# Patient Record
Sex: Male | Born: 1960 | Race: White | Hispanic: No | Marital: Married | Smoking: Current every day smoker
Health system: Southern US, Community
[De-identification: ages and names within clinical notes are randomized; demographics above are authoritative.]

## PROBLEM LIST (undated history)

## (undated) HISTORY — PX: FOOT SURGERY: SHX648

---

## 2015-02-26 ENCOUNTER — Emergency Department (INDEPENDENT_AMBULATORY_CARE_PROVIDER_SITE_OTHER)
Admission: EM | Admit: 2015-02-26 | Discharge: 2015-02-26 | Disposition: A | Discharge status: Home / Self Care | Payer: BLUE CROSS/BLUE SHIELD | Source: Home / Self Care | Attending: Emergency Medicine | Admitting: Emergency Medicine

## 2015-02-26 DIAGNOSIS — L723 Sebaceous cyst: Secondary | ICD-10-CM

## 2015-02-26 MED ORDER — SULFAMETHOXAZOLE-TRIMETHOPRIM 800-160 MG PO TABS: 1.0000 | ORAL_TABLET | Freq: Two times a day (BID) | ORAL | Status: AC

## 2015-02-26 NOTE — Discharge Instructions (Signed)
Epidermal Cyst An epidermal cyst is sometimes called a sebaceous cyst, epidermal inclusion cyst, or infundibular cyst. These cysts usually contain a substance that looks "pasty" or "cheesy" and may have a bad smell. This substance is a protein called keratin. Epidermal cysts are usually found on the face, neck, or trunk. They may also occur in the vaginal area or other parts of the genitalia of both men and women. Epidermal cysts are usually small, painless, slow-growing bumps or lumps that move freely under the skin. It is important not to try to pop them. This may cause an infection and lead to tenderness and swelling. CAUSES  Epidermal cysts may be caused by a deep penetrating injury to the skin or a plugged hair follicle, often associated with acne. SYMPTOMS  Epidermal cysts can become inflamed and cause:  Redness.  Tenderness.  Increased temperature of the skin over the bumps or lumps.  Grayish-white, bad smelling material that drains from the bump or lump. DIAGNOSIS  Epidermal cysts are easily diagnosed by your caregiver during an exam. Rarely, a tissue sample (biopsy) may be taken to rule out other conditions that may resemble epidermal cysts. TREATMENT   Epidermal cysts often get better and disappear on their own. They are rarely ever cancerous.  If a cyst becomes infected, it may become inflamed and tender. This may require opening and draining the cyst. Treatment with antibiotics may be necessary. When the infection is gone, the cyst may be removed with minor surgery.  Small, inflamed cysts can often be treated with antibiotics or by injecting steroid medicines.  Sometimes, epidermal cysts become large and bothersome. If this happens, surgical removal in your caregiver's office may be necessary. HOME CARE INSTRUCTIONS  Only take over-the-counter or prescription medicines as directed by your caregiver.  Take your antibiotics as directed. Finish them even if you start to feel  better. SEEK MEDICAL CARE IF:   Your cyst becomes tender, red, or swollen.  Your condition is not improving or is getting worse.  You have any other questions or concerns. MAKE SURE YOU:  Understand these instructions.  Will watch your condition.  Will get help right away if you are not doing well or get worse.   This information is not intended to replace advice given to you by your health care provider. Make sure you discuss any questions you have with your health care provider.   Document Released: 04/06/2004 Document Revised: 07/29/2011 Document Reviewed: 11/12/2010 Elsevier Interactive Patient Education 2016 [employer]  Sebaceous Cyst Removal Sebaceous cyst removal is a procedure to remove a sac of oily material that forms under your skin (sebaceous cyst). Sebaceous cysts may also be called epidermoid cysts or keratin cysts. Normally, the skin secretes this oily material through a gland or a hair follicle. This type of cyst usually results when a skin gland or hair follicle becomes blocked. You may need this procedure if you have a sebaceous cyst that becomes large, uncomfortable, or infected. LET YOUR HEALTH CARE PROVIDER KNOW ABOUT:  Any allergies you have.  All medicines you are taking, including vitamins, herbs, eye drops, creams, and over-the-counter medicines.  Previous problems you or members of your family have had with the use of anesthetics.  Any blood disorders you have.  Previous surgeries you have had.  Medical conditions you have. RISKS AND COMPLICATIONS Generally, this is a safe procedure. However, problems may occur, including:  Developing another cyst.  Bleeding.  Infection.  Scarring. BEFORE THE PROCEDURE  Ask your health   care provider about:  Changing or stopping your regular medicines. This is especially important if you are taking diabetes medicines or blood thinners.  Taking medicines such as aspirin and ibuprofen. These medicines  can thin your blood. Do not take these medicines before your procedure if your health care provider instructs you not to.  If you have an infected cyst, you may have to take antibiotic medicines before or after the cyst removal. Take your antibiotics as directed by your health care provider. Finish all of the medicine even if you start to feel better.  Take a shower on the morning of your procedure. Your health care provider may ask you to use a germ-killing (antiseptic) soap. PROCEDURE  You will be given a medicine that numbs the area (local anesthetic).  The skin around the cyst will be cleaned with a germ-killing solution (antiseptic).  Your health care provider will make a small surgical incision over the cyst.  The cyst will be separated from the surrounding tissues that are under your skin.  If possible, the cyst will be removed undamaged (intact).  If the cyst bursts (ruptures), it will need to be removed in pieces.  After the cyst is removed, your health care provider will control any bleeding and close the incision with small stitches (sutures). Small incisions may not need sutures, and the bleeding will be controlled by applying direct pressure with gauze.  Your health care provider may apply antibiotic ointment and a light bandage (dressing) over the incision. This procedure may vary among health care providers and hospitals. AFTER THE PROCEDURE  If your cyst ruptured during surgery, you may need to take antibiotic medicine. If you were prescribed an antibiotic medicine, finish all of it even if you start to feel better.   This information is not intended to replace advice given to you by your health care provider. Make sure you discuss any questions you have with your health care provider.   Document Released: 05/03/2000 Document Revised: 05/27/2014 Document Reviewed: 01/19/2014 Elsevier Interactive Patient Education 2016 Elsevier Inc.   

## 2015-02-26 NOTE — ED Notes (Signed)
Patient states that he has a "bump" on his ear that he thought was a pimple and he tried to "mash" it, it is now red and painful for approx 2 days

## 2015-02-27 NOTE — ED Provider Notes (Signed)
CSN: [id number]     Arrival date & time 02/26/15  1616 History   First MD Initiated Contact with Patient 02/26/15 1637     Chief Complaint  Patient presents with  . Cyst    Left ear      (Consider location/radiation/quality/duration/timing/severity/associated sxs/prior Treatment) Patient is a 54 y.o. male presenting with abscess. The history is provided by the patient. No language interpreter was used.  Abscess Location:  Face Facial abscess location:  Face Size:  2 cm Abscess quality: redness and warmth   Abscess quality: not draining   Red streaking: no   Duration:  2 days Progression:  Worsening Chronicity:  New Context: not diabetes   Relieved by:  Nothing Worsened by:  Nothing tried Ineffective treatments:  None tried Associated symptoms: no fever     History reviewed. No pertinent past medical history. History reviewed. No pertinent past surgical history. History reviewed. No pertinent family history. Social History  Substance Use Topics  . Smoking status: Current Every Day Smoker  . Smokeless tobacco: Never Used  . Alcohol Use: Yes    Review of Systems  Constitutional: Negative for fever.  All other systems reviewed and are negative.     Allergies  Penicillins  Home Medications   Prior to Admission medications   Medication Sig Start Date End Date Taking? Authorizing Provider  sulfamethoxazole-trimethoprim (BACTRIM DS,SEPTRA DS) 800-160 MG tablet Take 1 tablet by mouth 2 (two) times daily. 02/26/15 03/05/15  Lonia Skinner Mikah Poss, PA-C   BP 149/89 mmHg  Pulse 67  Temp(Src) 98.4 F (36.9 C)  Ht 6' (1.829 m)  Wt 230 lb (104.327 kg)  BMI 31.19 kg/m2  SpO2 98% Physical Exam  Constitutional: He is oriented to person, place, and time. He appears well-developed and well-nourished.  HENT:  Head: Normocephalic.  Eyes: EOM are normal.  Neck: Normal range of motion.  Pulmonary/Chest: Effort normal.  Abdominal: He exhibits no distension.  Musculoskeletal:  Normal range of motion.  Neurological: He is alert and oriented to person, place, and time.  Skin:  2cm pointing abscess/cyst ear    Psychiatric: He has a normal mood and affect.  Nursing note and vitals reviewed.   ED Course  .Marland KitchenIncision and Drainage Date/Time: 02/27/2015 9:30 AM Performed by: Elson Areas Authorized by: Elson Areas Consent: Verbal consent obtained. Risks and benefits: risks, benefits and alternatives were discussed Consent given by: patient Patient understanding: patient states understanding of the procedure being performed Patient identity confirmed: verbally with patient Time out: Immediately prior to procedure a "time out" was called to verify the correct patient, procedure, equipment, support staff and site/side marked as required. Type: cyst Body area: head Location details: left external ear Anesthesia: local infiltration Local anesthetic: lidocaine 2% without epinephrine Patient sedated: no Scalpel size: 11 Needle gauge: 22 Incision type: single straight Complexity: simple Drainage: purulent Drainage amount: moderate Wound treatment: wound left open Patient tolerance: Patient tolerated the procedure well with no immediate complications Comments: Large amount of purulent drainage and cystic material including capsule removed   (including critical care time) Labs Review Labs Reviewed - No data to display  Imaging Review No results found. I have personally reviewed and evaluated these images and lab results as part of my medical decision-making.   EKG Interpretation None      MDM  Pt counseled on leaving wound open and drainage.  Pt given rx for bactrim  He is to return if any problems.   Final diagnoses:  Sebaceous cyst  of ear    AVS    Elson Areas, PA-C 02/27/15 [phone]

## 2015-08-22 ENCOUNTER — Emergency Department (INDEPENDENT_AMBULATORY_CARE_PROVIDER_SITE_OTHER)
Admission: EM | Admit: 2015-08-22 | Discharge: 2015-08-22 | Disposition: A | Discharge status: Home / Self Care | Payer: BLUE CROSS/BLUE SHIELD | Source: Home / Self Care | Attending: Family Medicine | Admitting: Family Medicine

## 2015-08-22 ENCOUNTER — Ambulatory Visit (INDEPENDENT_AMBULATORY_CARE_PROVIDER_SITE_OTHER): Payer: BLUE CROSS/BLUE SHIELD | Admitting: Family Medicine

## 2015-08-22 ENCOUNTER — Emergency Department (INDEPENDENT_AMBULATORY_CARE_PROVIDER_SITE_OTHER): Payer: BLUE CROSS/BLUE SHIELD

## 2015-08-22 ENCOUNTER — Encounter: Payer: Self-pay | Admitting: *Deleted

## 2015-08-22 DIAGNOSIS — M25462 Effusion, left knee: Secondary | ICD-10-CM

## 2015-08-22 DIAGNOSIS — M7042 Prepatellar bursitis, left knee: Secondary | ICD-10-CM | POA: Diagnosis not present

## 2015-08-22 NOTE — ED Provider Notes (Signed)
CSN: [id number]     Arrival date & time 08/22/15  1209 History   First MD Initiated Contact with Patient 08/22/15 1226     Chief Complaint  Patient presents with  . Knee Pain   (Consider location/radiation/quality/duration/timing/severity/associated sxs/prior Treatment) HPI  The pt is a 55yo male presenting to [facility] with c/o moderate to significant amount of Left knee swelling that started about 2 weeks ago and has been constant since onset. Denies pain.  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 He occasionally wears kneepads but not all the time. Denies redness, warmth, fever, chills. Denies hx of gout. Denies hx of similar knee swelling. No known injuries.   History reviewed. No pertinent past medical history. Past Surgical History  Procedure Laterality Date  . Foot surgery Left     heel   Family History  Problem Relation Age of Onset  . Diabetes Mother   . Cancer Father    Social History  Substance Use Topics  . Smoking status: Current Every Day Smoker  . Smokeless tobacco: Never Used  . Alcohol Use: Yes    Review of Systems  Constitutional: Negative for fever and chills.  Musculoskeletal: Positive for joint swelling. Negative for myalgias and arthralgias.       Left knee swelling  Skin: Negative for color change and wound.    Allergies  Penicillins  Home Medications   Prior to Admission medications   Not on File   Meds Ordered and Administered this Visit  Medications - No data to display  BP 129/88 mmHg  Pulse 77  Wt 223 lb (101.152 kg)  SpO2 97% No data found.   Physical Exam  Constitutional: He is oriented to person, place, and time. He appears well-developed and well-nourished.  HENT:  Head: Normocephalic and atraumatic.  Eyes: EOM are normal.  Neck: Normal range of motion.  Cardiovascular: Normal rate.   Pulmonary/Chest: Effort normal.  Musculoskeletal: Normal range of motion. He exhibits edema. He exhibits no tenderness.  Left  knee: moderate to significant amount of swelling over patella and tibial tuberosity. Non-tender. Fluctuance present. Full ROM knee. No tenderness to posterior, medial or lateral aspects of knee. Calf is soft, non-tender.   Neurological: He is alert and oriented to person, place, and time.  Skin: Skin is warm and dry. No erythema.  Left knee: skin in tact. No erythema, ecchymosis or warmth.   Psychiatric: He has a normal mood and affect. His behavior is normal.  Nursing note and vitals reviewed.   ED Course  Procedures (including critical care time)  Labs Review Labs Reviewed  BODY FLUID CULTURE  SYNOVIAL CELL COUNT + DIFF, W/ CRYSTALS    Imaging Review Dg Knee Complete 4 Views Left  08/22/2015  CLINICAL DATA:  Left anterior knee pain and swelling for 3 weeks, worked on hardfloor EXAM: LEFT KNEE - COMPLETE 4+ VIEW COMPARISON:  None. FINDINGS: Four views of the left knee submitted. No acute fracture or subluxation. Significant soft tissue swelling is noted prepatellar infrapatellar. Prepatellar bursitis cannot be excluded. No joint effusion. IMPRESSION: There is prepatellar and infrapatellar soft tissue swelling. Prepatellar bursitis or cellulitis cannot be excluded. Clinical correlation is necessary. No joint effusion. No acute fracture or subluxation. Electronically Signed   By: Natasha MeadLiviu  Pop M.D.   On: 08/22/2015 12:49       MDM   1. Effusion of bursa of knee, left   2. Knee swelling, left    Pt c/o Left knee  swelling.  Exam c/o bursitis of Left knee. Low concern for septic joint or gout at this time.  Consulted with Dr. Denyse Amass, Sports Medicine, who also examined pt and imaging.  Knee drained in UC.  See consult note by Dr. Denyse Amass.  Pt to f/u with Dr. Denyse Amass in 2-4 weeks. Home care instructions provided.    Junius Finner, PA-C 08/22/15 1404

## 2015-08-22 NOTE — ED Notes (Signed)
Pt c/o 2 weeks of left knee pain and swelling. He installs floors for a living.

## 2015-08-22 NOTE — Assessment & Plan Note (Signed)
Status post aspiration and injection today. Culture differential crystal analysis pending.  Plan for knee sleeve and knee padding. Return in 2-4 weeks.

## 2015-08-22 NOTE — Patient Instructions (Signed)
Thank you for coming in today. Use a neoprene knee sleeve.  Use knee pads.  Return in 2-4 weeks if needed.   Prepatellar Bursitis With Rehab  Bursitis is a condition that is characterized by inflammation of a bursa. Sergio Nguyen exists in many areas of the body. They are fluid-filled sacs that lie between a soft tissue (skin, tendon, or ligament) and a bone, and they reduce friction between the structures as well as the stress placed on the soft tissue. Prepatellar bursitis is inflammation of the bursa that lies between the skin and the kneecap (patella). This condition often causes pain over the patella. SYMPTOMS   Pain, tenderness, and/or inflammation over the patella.  Pain that worsens with movement of the knee joint.  Decreased range of motion for the knee joint.  A crackling sound (crepitation) when the bursa is moved or touched.  Occasionally, painless swelling of the bursa.  Fever (when infected). CAUSES  Bursitis is caused by damage to the bursa, which results in an inflammatory response. Common mechanisms of injury include:  Direct trauma to the front of the knee.  Repetitive and/or stressful use of the knee. RISK INCREASES WITH:  Activities in which kneeling and/or falling on one's knees is likely (volleyball or football).  Repetitive and stressful training, especially if it involves running on hills.  Improper training techniques, such as a sudden increase in the intensity, frequency, or duration of training.  Failure to warm up properly before activity.  Poor technique.  Artificial turf. PREVENTION   Avoid kneeling or falling on your knees.  Warm up and stretch properly before activity.  Allow for adequate recovery between workouts.  Maintain physical fitness:  Strength, flexibility, and endurance.  Cardiovascular fitness.  Learn and use proper technique. When possible, have a coach correct improper technique.  Wear properly fitted and padded protective  equipment (knee pads). PROGNOSIS  If treated properly, then the symptoms of prepatellar bursitis usually resolve within 2 weeks. RELATED COMPLICATIONS   Recurrent symptoms that result in a chronic problem.  Prolonged healing time, if improperly treated or reinjured.  Limited range of motion.  Infection of bursa.  Chronic inflammation or scarring of bursa. TREATMENT  Treatment initially involves the use of ice and medication to help reduce pain and inflammation. The use of strengthening and stretching exercises may help reduce pain with activity, especially those of the quadriceps and hamstring muscles. These exercises may be performed at home or with referral to a therapist. Your caregiver may recommend knee pads when you return to playing sports, in order to reduce the stress on the prepatellar bursa. If symptoms persist despite treatment, then your caregiver may drain fluid out with a needle (aspirate) the bursa. If symptoms persist for greater than 6 months despite nonsurgical (conservative) treatment, then surgery may be recommended to remove the bursa.  MEDICATION  If pain medication is necessary, then nonsteroidal anti-inflammatory medications, such as aspirin and ibuprofen, or other minor pain relievers, such as acetaminophen, are often recommended.  Do not take pain medication for 7 days before surgery.  Prescription pain relievers may be given if deemed necessary by your caregiver. Use only as directed and only as much as you need.  Corticosteroid injections may be given by your caregiver. These injections should be reserved for the most serious cases, because they may only be given a certain number of times. HEAT AND COLD  Cold treatment (icing) relieves pain and reduces inflammation. Cold treatment should be applied for 10 to 15 minutes  every 2 to 3 hours for inflammation and pain and immediately after any activity that aggravates your symptoms. Use ice packs or massage the area  with a piece of ice (ice massage).  Heat treatment may be used prior to performing the stretching and strengthening activities prescribed by your caregiver, physical therapist, or athletic trainer. Use a heat pack or soak the injury in warm water. SEEK MEDICAL CARE IF:  Treatment seems to offer no benefit, or the condition worsens.  Any medications produce adverse side effects. EXERCISES RANGE OF MOTION (ROM) AND STRETCHING EXERCISES - Prepatellar Bursitis These exercises may help you when beginning to rehabilitate your injury. Your symptoms may resolve with or without further involvement from your physician, physical therapist or athletic trainer. While completing these exercises, remember:   Restoring tissue flexibility helps normal motion to return to the joints. This allows healthier, less painful movement and activity.  An effective stretch should be held for at least 30 seconds.  A stretch should never be painful. You should only feel a gentle lengthening or release in the stretched tissue. STRETCH - Hamstrings, Standing  Stand or sit and extend your right / left leg, placing your foot on a chair or foot stool  Keeping a slight arch in your low back and your hips straight forward.  Lead with your chest and lean forward at the waist until you feel a gentle stretch in the back of your right / left knee or thigh. (When done correctly, this exercise requires leaning only a small distance.)  Hold this position for __________ seconds. Repeat __________ times. Complete this stretch __________ times per day. STRETCH - Quadriceps, Prone   Lie on your stomach on a firm surface, such as a bed or padded floor.  Bend your right / left knee and grasp your ankle. If you are unable to reach, your ankle or pant leg, use a belt around your foot to lengthen your reach.  Gently pull your heel toward your buttocks. Your knee should not slide out to the side. You should feel a stretch in the front  of your thigh and/or knee.  Hold this position for __________ seconds. Repeat __________ times. Complete this stretch __________ times per day.  STRETCH - Hamstrings/Adductors, V-Sit   Sit on the floor with your legs extended in a large "V," keeping your knees straight.  With your head and chest upright, bend at your waist reaching for your right foot to stretch your left adductors.  You should feel a stretch in your left inner thigh. Hold for __________ seconds.  Return to the upright position to relax your leg muscles.  Continuing to keep your chest upright, bend straight forward at your waist to stretch your hamstrings.  You should feel a stretch behind both of your thighs and/or knees. Hold for __________ seconds.  Return to the upright position to relax your leg muscles.  Repeat steps 2 through 4. Repeat __________ times. Complete this exercise __________ times per day.  STRENGTHENING EXERCISES - Prepatellar Bursitis  These exercises may help you when beginning to rehabilitate your injury. They may resolve your symptoms with or without further involvement from your physician, physical therapist or athletic trainer. While completing these exercises, remember:  Muscles can gain both the endurance and the strength needed for everyday activities through controlled exercises.  Complete these exercises as instructed by your physician, physical therapist or athletic trainer. Progress the resistance and repetitions only as guided. STRENGTH - Quadriceps, Isometrics  Lie on your  back with your right / left leg extended and your opposite knee bent.  Gradually tense the muscles in the front of your right / left thigh. You should see either your kneecap slide up toward your hip or increased dimpling just above the knee. This motion will push the back of the knee down toward the floor/mat/bed on which you are lying.  Hold the muscle as tight as you can without increasing your pain for  __________ seconds.  Relax the muscles slowly and completely in between each repetition. Repeat __________ times. Complete this exercise __________ times per day.  STRENGTH - Quadriceps, Short Arcs   Lie on your back. Place a __________ inch towel roll under your knee so that the knee slightly bends.  Raise only your lower leg by tightening the muscles in the front of your thigh. Do not allow your thigh to rise.  Hold this position for __________ seconds. Repeat __________ times. Complete this exercise __________ times per day.  OPTIONAL ANKLE WEIGHTS: Begin with ____________________, but DO NOT exceed ____________________. Increase in1 lb/0.5 kg increments.  STRENGTH - Quadriceps, Straight Leg Raises  Quality counts! Watch for signs that the quadriceps muscle is working to insure you are strengthening the correct muscles and not "cheating" by substituting with healthier muscles.  Lay on your back with your right / left leg extended and your opposite knee bent.  Tense the muscles in the front of your right / left thigh. You should see either your kneecap slide up or increased dimpling just above the knee. Your thigh may even quiver.  Tighten these muscles even more and raise your leg 4 to 6 inches off the floor. Hold for __________ seconds.  Keeping these muscles tense, lower your leg.  Relax the muscles slowly and completely in between each repetition. Repeat __________ times. Complete this exercise __________ times per day.  STRENGTH - Quadriceps, Step-Ups   Use a thick book, step or step stool that is __________ inches tall.  Holding a wall or counter for balance only, not support.  Slowly step-up with your right / left foot, keeping your knee in line with your hip and foot. Do not allow your knee to bend so far that you cannot see your toes.  Slowly unlock your knee and lower yourself to the starting position. Your muscles, not gravity, should lower you. Repeat __________  times. Complete this exercise __________ times per day.   This information is not intended to replace advice given to you by your health care provider. Make sure you discuss any questions you have with your health care provider.   Document Released: 05/06/2005 Document Revised: 01/25/2015 Document Reviewed: 08/18/2008 Elsevier Interactive Patient Education [employer]

## 2015-08-22 NOTE — Progress Notes (Signed)
   Subjective:    I'm seeing this patient as a consultation for:  Junius FinnerErin O'Malley PA-C  CC: Left knee swelling  HPI: Patient notes a 3 week history of swelling at the anterior portion of his left knee. 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 He denies any specific injury. He denies pain fevers or chills. He feels well. He has not tried any treatment yet. No locking or catching or difficulty with knee motion.  Past medical history, Surgical history, Family history not pertinant except as noted below, Social history, Allergies, and medications have been entered into the medical record, reviewed, and no changes needed.   Review of Systems: No headache, visual changes, nausea, vomiting, diarrhea, constipation, dizziness, abdominal pain, skin rash, fevers, chills, night sweats, weight loss, swollen lymph nodes, body aches, joint swelling, muscle aches, chest pain, shortness of breath, mood changes, visual or auditory hallucinations.   Objective:   BP 129/88 mmHg  Pulse 77  Wt 223 lb (101.152 kg)  SpO2 97% General: Well Developed, well nourished, and in no acute distress.  Neuro/Psych: Alert and oriented x3, extra-ocular muscles intact, able to move all 4 extremities, sensation grossly intact. Skin: Warm and dry, no rashes noted.  Respiratory: Not using accessory muscles, speaking in full sentences, trachea midline.  Cardiovascular: Pulses palpable, no extremity edema. Abdomen: Does not appear distended. MSK: Left knee is normal-appearing with the exception of fluctuance on the anterior portion of the knee. No tenderness induration or erythema is present. Knee motion is normal. Stable ligamentous exam.  Aspiration and injection of left knee prepatellar bursitis: Consent obtained and timeout performed. Skin cleaned with alcohol and cold spray applied. 18-gauge needle used to access the prepatellar bursa. 20 ml of serosanguineous fluid was  removed. Syringe was exchanged and 80 mg of Depo-Medrol and 1 mL of lidocaine without epinephrine was injected. Compress Dressing was applied. Patient tolerated the procedure well.  No results found for this or any previous visit (from the past 24 hour(s)). Dg Knee Complete 4 Views Left  08/22/2015  CLINICAL DATA:  Left anterior knee pain and swelling for 3 weeks, worked on hardfloor EXAM: LEFT KNEE - COMPLETE 4+ VIEW COMPARISON:  None. FINDINGS: Four views of the left knee submitted. No acute fracture or subluxation. Significant soft tissue swelling is noted prepatellar infrapatellar. Prepatellar bursitis cannot be excluded. No joint effusion. IMPRESSION: There is prepatellar and infrapatellar soft tissue swelling. Prepatellar bursitis or cellulitis cannot be excluded. Clinical correlation is necessary. No joint effusion. No acute fracture or subluxation. Electronically Signed   By: Natasha MeadLiviu  Pop M.D.   On: 08/22/2015 12:49    Impression and Recommendations:   This case required medical decision making of moderate complexity.

## 2015-08-23 LAB — SYNOVIAL CELL COUNT + DIFF, W/ CRYSTALS
BASOPHILS, %: 0 %
EOSINOPHILS-SYNOVIAL: 0 % (ref 0–2)
LYMPHOCYTES-SYNOVIAL FLD: 18 % (ref 0–74)
Monocyte/Macrophage: 73 % — ABNORMAL HIGH (ref 0–69)
NEUTROPHIL, SYNOVIAL: 9 % (ref 0–24)
Synoviocytes, %: 6 % (ref 0–15)
WBC, SYNOVIAL: 390 {cells}/uL — AB (ref <150)

## 2015-08-25 NOTE — Progress Notes (Signed)
Quick Note:  Culture of fluid from knee is negative so far ______

## 2015-08-26 ENCOUNTER — Telehealth: Payer: Self-pay | Admitting: Emergency Medicine

## 2015-08-26 LAB — BODY FLUID CULTURE
Gram Stain: NONE SEEN
ORGANISM ID, BACTERIA: NO GROWTH

## 2015-08-28 NOTE — Progress Notes (Signed)
Quick Note:  Normal, no changes. ______ 

## 2015-09-15 ENCOUNTER — Encounter: Payer: Self-pay | Admitting: Family Medicine

## 2015-09-15 DIAGNOSIS — M11269 Other chondrocalcinosis, unspecified knee: Secondary | ICD-10-CM | POA: Insufficient documentation

## 2015-09-15 NOTE — Progress Notes (Signed)
Quick Note:  Fluid removed from knee shows pseudogout. Please return to clinic to discuss this further. ______

## 2015-10-09 ENCOUNTER — Ambulatory Visit (INDEPENDENT_AMBULATORY_CARE_PROVIDER_SITE_OTHER): Payer: BLUE CROSS/BLUE SHIELD | Admitting: Family Medicine

## 2015-10-09 ENCOUNTER — Encounter: Payer: Self-pay | Admitting: Family Medicine

## 2015-10-09 VITALS — BP 131/87 | HR 83 | Wt 223.0 lb

## 2015-10-09 DIAGNOSIS — M11262 Other chondrocalcinosis, left knee: Secondary | ICD-10-CM

## 2015-10-09 DIAGNOSIS — M11862 Other specified crystal arthropathies, left knee: Secondary | ICD-10-CM

## 2015-10-09 DIAGNOSIS — M7042 Prepatellar bursitis, left knee: Secondary | ICD-10-CM | POA: Diagnosis not present

## 2015-10-09 NOTE — Patient Instructions (Signed)
Thank you for coming in today. Call or go to the ER if you develop a large red swollen joint with extreme pain or oozing puss.  Keep off your knee if able.  If it returns there is a surgery.  If it is not bothersome I am not sure it makes sense to keep draining it.   Prepatellar Bursitis With Rehab  Bursitis is a condition that is characterized by inflammation of a bursa. Kateri Mc exists in many areas of the body. They are fluid-filled sacs that lie between a soft tissue (skin, tendon, or ligament) and a bone, and they reduce friction between the structures as well as the stress placed on the soft tissue. Prepatellar bursitis is inflammation of the bursa that lies between the skin and the kneecap (patella). This condition often causes pain over the patella. SYMPTOMS   Pain, tenderness, and/or inflammation over the patella.  Pain that worsens with movement of the knee joint.  Decreased range of motion for the knee joint.  A crackling sound (crepitation) when the bursa is moved or touched.  Occasionally, painless swelling of the bursa.  Fever (when infected). CAUSES  Bursitis is caused by damage to the bursa, which results in an inflammatory response. Common mechanisms of injury include:  Direct trauma to the front of the knee.  Repetitive and/or stressful use of the knee. RISK INCREASES WITH:  Activities in which kneeling and/or falling on one's knees is likely (volleyball or football).  Repetitive and stressful training, especially if it involves running on hills.  Improper training techniques, such as a sudden increase in the intensity, frequency, or duration of training.  Failure to warm up properly before activity.  Poor technique.  Artificial turf. PREVENTION   Avoid kneeling or falling on your knees.  Warm up and stretch properly before activity.  Allow for adequate recovery between workouts.  Maintain physical fitness:  Strength, flexibility, and  endurance.  Cardiovascular fitness.  Learn and use proper technique. When possible, have a coach correct improper technique.  Wear properly fitted and padded protective equipment (knee pads). PROGNOSIS  If treated properly, then the symptoms of prepatellar bursitis usually resolve within 2 weeks. RELATED COMPLICATIONS   Recurrent symptoms that result in a chronic problem.  Prolonged healing time, if improperly treated or reinjured.  Limited range of motion.  Infection of bursa.  Chronic inflammation or scarring of bursa. TREATMENT  Treatment initially involves the use of ice and medication to help reduce pain and inflammation. The use of strengthening and stretching exercises may help reduce pain with activity, especially those of the quadriceps and hamstring muscles. These exercises may be performed at home or with referral to a therapist. Your caregiver may recommend knee pads when you return to playing sports, in order to reduce the stress on the prepatellar bursa. If symptoms persist despite treatment, then your caregiver may drain fluid out with a needle (aspirate) the bursa. If symptoms persist for greater than 6 months despite nonsurgical (conservative) treatment, then surgery may be recommended to remove the bursa.  MEDICATION  If pain medication is necessary, then nonsteroidal anti-inflammatory medications, such as aspirin and ibuprofen, or other minor pain relievers, such as acetaminophen, are often recommended.  Do not take pain medication for 7 days before surgery.  Prescription pain relievers may be given if deemed necessary by your caregiver. Use only as directed and only as much as you need.  Corticosteroid injections may be given by your caregiver. These injections should be reserved for the  most serious cases, because they may only be given a certain number of times. HEAT AND COLD  Cold treatment (icing) relieves pain and reduces inflammation. Cold treatment should  be applied for 10 to 15 minutes every 2 to 3 hours for inflammation and pain and immediately after any activity that aggravates your symptoms. Use ice packs or massage the area with a piece of ice (ice massage).  Heat treatment may be used prior to performing the stretching and strengthening activities prescribed by your caregiver, physical therapist, or athletic trainer. Use a heat pack or soak the injury in warm water. SEEK MEDICAL CARE IF:  Treatment seems to offer no benefit, or the condition worsens.  Any medications produce adverse side effects. EXERCISES RANGE OF MOTION (ROM) AND STRETCHING EXERCISES - Prepatellar Bursitis These exercises may help you when beginning to rehabilitate your injury. Your symptoms may resolve with or without further involvement from your physician, physical therapist or athletic trainer. While completing these exercises, remember:   Restoring tissue flexibility helps normal motion to return to the joints. This allows healthier, less painful movement and activity.  An effective stretch should be held for at least 30 seconds.  A stretch should never be painful. You should only feel a gentle lengthening or release in the stretched tissue. STRETCH - Hamstrings, Standing  Stand or sit and extend your right / left leg, placing your foot on a chair or foot stool  Keeping a slight arch in your low back and your hips straight forward.  Lead with your chest and lean forward at the waist until you feel a gentle stretch in the back of your right / left knee or thigh. (When done correctly, this exercise requires leaning only a small distance.)  Hold this position for __________ seconds. Repeat __________ times. Complete this stretch __________ times per day. STRETCH - Quadriceps, Prone   Lie on your stomach on a firm surface, such as a bed or padded floor.  Bend your right / left knee and grasp your ankle. If you are unable to reach, your ankle or pant leg, use a  belt around your foot to lengthen your reach.  Gently pull your heel toward your buttocks. Your knee should not slide out to the side. You should feel a stretch in the front of your thigh and/or knee.  Hold this position for __________ seconds. Repeat __________ times. Complete this stretch __________ times per day.  STRETCH - Hamstrings/Adductors, V-Sit   Sit on the floor with your legs extended in a large "V," keeping your knees straight.  With your head and chest upright, bend at your waist reaching for your right foot to stretch your left adductors.  You should feel a stretch in your left inner thigh. Hold for __________ seconds.  Return to the upright position to relax your leg muscles.  Continuing to keep your chest upright, bend straight forward at your waist to stretch your hamstrings.  You should feel a stretch behind both of your thighs and/or knees. Hold for __________ seconds.  Return to the upright position to relax your leg muscles.  Repeat steps 2 through 4. Repeat __________ times. Complete this exercise __________ times per day.  STRENGTHENING EXERCISES - Prepatellar Bursitis  These exercises may help you when beginning to rehabilitate your injury. They may resolve your symptoms with or without further involvement from your physician, physical therapist or athletic trainer. While completing these exercises, remember:  Muscles can gain both the endurance and the strength needed  for everyday activities through controlled exercises.  Complete these exercises as instructed by your physician, physical therapist or athletic trainer. Progress the resistance and repetitions only as guided. STRENGTH - Quadriceps, Isometrics  Lie on your back with your right / left leg extended and your opposite knee bent.  Gradually tense the muscles in the front of your right / left thigh. You should see either your kneecap slide up toward your hip or increased dimpling just above the  knee. This motion will push the back of the knee down toward the floor/mat/bed on which you are lying.  Hold the muscle as tight as you can without increasing your pain for __________ seconds.  Relax the muscles slowly and completely in between each repetition. Repeat __________ times. Complete this exercise __________ times per day.  STRENGTH - Quadriceps, Short Arcs   Lie on your back. Place a __________ inch towel roll under your knee so that the knee slightly bends.  Raise only your lower leg by tightening the muscles in the front of your thigh. Do not allow your thigh to rise.  Hold this position for __________ seconds. Repeat __________ times. Complete this exercise __________ times per day.  OPTIONAL ANKLE WEIGHTS: Begin with ____________________, but DO NOT exceed ____________________. Increase in1 lb/0.5 kg increments.  STRENGTH - Quadriceps, Straight Leg Raises  Quality counts! Watch for signs that the quadriceps muscle is working to insure you are strengthening the correct muscles and not "cheating" by substituting with healthier muscles.  Lay on your back with your right / left leg extended and your opposite knee bent.  Tense the muscles in the front of your right / left thigh. You should see either your kneecap slide up or increased dimpling just above the knee. Your thigh may even quiver.  Tighten these muscles even more and raise your leg 4 to 6 inches off the floor. Hold for __________ seconds.  Keeping these muscles tense, lower your leg.  Relax the muscles slowly and completely in between each repetition. Repeat __________ times. Complete this exercise __________ times per day.  STRENGTH - Quadriceps, Step-Ups   Use a thick book, step or step stool that is __________ inches tall.  Holding a wall or counter for balance only, not support.  Slowly step-up with your right / left foot, keeping your knee in line with your hip and foot. Do not allow your knee to bend so  far that you cannot see your toes.  Slowly unlock your knee and lower yourself to the starting position. Your muscles, not gravity, should lower you. Repeat __________ times. Complete this exercise __________ times per day.   This information is not intended to replace advice given to you by your health care provider. Make sure you discuss any questions you have with your health care provider.   Document Released: 05/06/2005 Document Revised: 01/25/2015 Document Reviewed: 08/18/2008 Elsevier Interactive Patient Education [employer]

## 2015-10-09 NOTE — Progress Notes (Signed)
   Sergio Nguyen is a 55 y.o. male who [facility] today for follow-up prepatellar bursitis. Patient was seen last month for prepatellar bursitis of the left knee. Aspiration and injection. Fluid aspiration revealed pseudogout crystals without infection. He felt a bit better however was unable to stay off of his knees. He works as a Art gallery managerhardwood floor installer. Symptoms returned recently. He notes painless swelling without injury.   No past medical history on file. Past Surgical History  Procedure Laterality Date  . Foot surgery Left     heel   Social History  Substance Use Topics  . Smoking status: Current Every Day Smoker  . Smokeless tobacco: Never Used  . Alcohol Use: Yes   family history includes Cancer in his father; Diabetes in his mother.  ROS:  No headache, visual changes, nausea, vomiting, diarrhea, constipation, dizziness, abdominal pain, skin rash, fevers, chills, night sweats, weight loss, swollen lymph nodes, body aches, joint swelling, muscle aches, chest pain, shortness of breath, mood changes, visual or auditory hallucinations.    Medications: No current outpatient prescriptions on file.   No current facility-administered medications for this visit.   Allergies  Allergen Reactions  . Penicillins      Exam:  BP 131/87 mmHg  Pulse 83  Wt 223 lb (101.152 kg) General: Well Developed, well nourished, and in no acute distress.  Neuro/Psych: Alert and oriented x3, extra-ocular muscles intact, able to move all 4 extremities, sensation grossly intact. Skin: Warm and dry, no rashes noted.  Respiratory: Not using accessory muscles, speaking in full sentences, trachea midline.  Cardiovascular: Pulses palpable, no extremity edema. Abdomen: Does not appear distended. MSK: Left knee large prepatellar bursa nontender without any skin erythema or induration.  Procedure: Real-time Ultrasound Guided aspiration and  injection of left prepatellar bursa  Device: GE Logiq E  Images permanently stored and available for review in the ultrasound unit. Verbal informed consent obtained. Discussed risks and benefits of procedure. Warned about infection bleeding damage to structures skin hypopigmentation and fat atrophy among others. Patient expresses understanding and agreement Time-out conducted.  Noted no overlying erythema, induration, or other signs of local infection.  Skin prepped in a sterile fashion.  Local anesthesia: Topical Ethyl chloride.  With sterile technique and under real time ultrasound guidance:18-gauge needle used to access the prepatellar space. 18 mL of blood colored fluid aspirated. Syringe exchanged and 40 mg of Kenalog and 4 mL of Marcaine injected  Completed without difficulty    Advised to call if fevers/chills, erythema, induration, drainage, or persistent bleeding.  Images permanently stored and available for review in the ultrasound unit.  Impression: Technically successful ultrasound guided injection.     No results found for this or any previous visit (from the past 24 hour(s)). No results found.  55 year old male with recurrent traumatic left knee prepatellar bursitis. Aspiration culture pending. Continue compressive knee sleeves and kneepads. Recommend patient not kneel for the next several weeks. Return as needed.

## 2015-10-12 NOTE — Progress Notes (Signed)
Quick Note:  Knee fluid culture is normal. ______

## 2015-10-13 LAB — BODY FLUID CULTURE
Gram Stain: NONE SEEN
Organism ID, Bacteria: NO GROWTH

## 2016-01-20 ENCOUNTER — Emergency Department (INDEPENDENT_AMBULATORY_CARE_PROVIDER_SITE_OTHER)
Admission: EM | Admit: 2016-01-20 | Discharge: 2016-01-20 | Disposition: A | Discharge status: Home / Self Care | Payer: BLUE CROSS/BLUE SHIELD | Source: Home / Self Care | Attending: Family Medicine | Admitting: Family Medicine

## 2016-01-20 ENCOUNTER — Encounter: Payer: Self-pay | Admitting: Emergency Medicine

## 2016-01-20 ENCOUNTER — Emergency Department (INDEPENDENT_AMBULATORY_CARE_PROVIDER_SITE_OTHER): Payer: BLUE CROSS/BLUE SHIELD

## 2016-01-20 DIAGNOSIS — S61209A Unspecified open wound of unspecified finger without damage to nail, initial encounter: Secondary | ICD-10-CM

## 2016-01-20 DIAGNOSIS — X58XXXA Exposure to other specified factors, initial encounter: Secondary | ICD-10-CM

## 2016-01-20 DIAGNOSIS — Z1810 Retained metal fragments, unspecified: Secondary | ICD-10-CM

## 2016-01-20 DIAGNOSIS — T1490XA Injury, unspecified, initial encounter: Secondary | ICD-10-CM

## 2016-01-20 DIAGNOSIS — S6982XA Other specified injuries of left wrist, hand and finger(s), initial encounter: Secondary | ICD-10-CM | POA: Diagnosis not present

## 2016-01-20 DIAGNOSIS — T1490 Injury, unspecified: Secondary | ICD-10-CM | POA: Diagnosis not present

## 2016-01-20 DIAGNOSIS — S61208A Unspecified open wound of other finger without damage to nail, initial encounter: Secondary | ICD-10-CM

## 2016-01-20 MED ORDER — DOXYCYCLINE HYCLATE 100 MG PO CAPS: 100.0000 mg | ORAL_CAPSULE | Freq: Two times a day (BID) | ORAL | 0 refills | Status: AC

## 2016-01-20 MED ORDER — IBUPROFEN 600 MG PO TABS: 600.0000 mg | ORAL_TABLET | Freq: Once | ORAL | Status: DC

## 2016-01-20 NOTE — ED Provider Notes (Signed)
[facility]    CSN: [id number] Arrival date & time: 01/20/16  1333  First Provider Contact:  First MD Initiated Contact with Patient 01/20/16 1443        History   Chief Complaint Chief Complaint  Patient presents with  . Hand Injury    HPI Olga MillersDarrell Botelho is a 55 y.o. male.   While using a table saw about two hours ago, patient just barely nicked the tip of his left third fingertip, including part of the fingernail.  His last Tdap was less than 6 years ago.   The history is provided by the patient.  Laceration  Location:  Finger Finger laceration location:  L middle finger Length:  8mm Depth:  Through dermis Quality: avulsion   Bleeding: controlled   Time since incident:  2 hours Injury mechanism: table saw blade. Pain details:    Quality:  Aching   Severity:  Mild   Timing:  Constant   Progression:  Unchanged Foreign body present:  No foreign bodies Relieved by:  Pressure Worsened by:  Movement Ineffective treatments:  None tried Tetanus status:  Up to date Associated symptoms: no numbness and no swelling     History reviewed. No pertinent past medical history.  Patient Active Problem List   Diagnosis Date Noted  . Pseudogout of knee 09/15/2015  . Prepatellar bursitis of left knee 08/22/2015    Past Surgical History:  Procedure Laterality Date  . FOOT SURGERY Left    heel       Home Medications    Prior to Admission medications   Medication Sig Start Date End Date Taking? Authorizing Provider  doxycycline (VIBRAMYCIN) 100 MG capsule Take 1 capsule (100 mg total) by mouth 2 (two) times daily. Take with food. 01/20/16   Lattie HawStephen A Ayslin Kundert, MD    Family History Family History  Problem Relation Age of Onset  . Diabetes Mother   . Cancer Father     Social History Social History  Substance Use Topics  . Smoking status: Current Every Day Smoker  . Smokeless tobacco: Never Used  . Alcohol use Yes     Allergies    Penicillins   Review of Systems Review of Systems  All other systems reviewed and are negative.    Physical Exam Triage Vital Signs ED Triage Vitals  Enc Vitals Group     BP 01/20/16 1405 167/100     Pulse Rate 01/20/16 1405 71     Resp 01/20/16 1405 16     Temp 01/20/16 1405 97.7 F (36.5 C)     Temp Source 01/20/16 1405 Oral     SpO2 01/20/16 1405 98 %     Weight 01/20/16 1406 213 lb (96.6 kg)     Height 01/20/16 1406 6' (1.829 m)     Head Circumference --      Peak Flow --      Pain Score 01/20/16 1407 4     Pain Loc --      Pain Edu? --      Excl. in GC? --    No data found.   Updated Vital Signs BP 167/100 (BP Location: Right Arm)   Pulse 71   Temp 97.7 F (36.5 C) (Oral)   Resp 16   Ht 6' (1.829 m)   Wt 213 lb (96.6 kg)   SpO2 98%   BMI 28.89 kg/m   Visual Acuity Right Eye Distance:   Left Eye Distance:   Bilateral Distance:  Right Eye Near:   Left Eye Near:    Bilateral Near:     Physical Exam  Constitutional: He appears well-developed and well-nourished. No distress.  HENT:  Head: Atraumatic.  Eyes: Pupils are equal, round, and reactive to light.  Cardiovascular: Normal rate.   Pulmonary/Chest: Effort normal.  Musculoskeletal:       Left hand: He exhibits normal range of motion, no tenderness, no bony tenderness, normal capillary refill and no swelling.       Hands: Left third fingertip has a superficial skin avulsion about 5mm wide by 8mm long extending to fingernail.  Involved segment of fingernail is absent, but nail bed is intact.  Finger has full range of motion all joints.  Wound cleansed with saline, and dressed with Mepelex Lite dressing.  Neurological: He is alert.  Skin: Skin is warm and dry.  Nursing note and vitals reviewed.    UC Treatments / Results  Labs (all labs ordered are listed, but only abnormal results are displayed) Labs Reviewed - No data to display  EKG  EKG Interpretation None       Radiology Dg  Finger Middle Left  Result Date: 01/20/2016 CLINICAL DATA:  Table saw injury to the distal left third finger. Initial encounter. EXAM: LEFT MIDDLE FINGER 2+V COMPARISON:  None. FINDINGS: No bony fracture or dislocation is identified. There is a small metallic radiopaque foreign body in the soft tissues at the tip of the digit. IMPRESSION: No fracture identified. Tiny metallic radiopaque foreign body in the tip of the left third finger. Electronically Signed   By: Irish Lack M.D.   On: 01/20/2016 14:27    Procedures Procedures (including critical care time)  Medications Ordered in UC Medications  ibuprofen (ADVIL,MOTRIN) tablet 600 mg (not administered)     Initial Impression / Assessment and Plan / UC Course  I have reviewed the triage vital signs and the nursing notes.  Pertinent labs & imaging results that were available during my care of the patient were reviewed by me and considered in my medical decision making (see chart for details).  Clinical Course  Wound shallow/superficial; no sutures indicated. Note that fingertip x-ray reveals a small foreign body present in soft tissues distally:  This foreign body is lateral to today's wound and apparently pre-existing. Begin doxycycline 100mg  BID Elevate hand as much as possible.  Keep bandage clean and dry.  Return for dressing change in 3 days.  May take Ibuprofen 200mg , 4 tabs every 8 hours with food for pain. If symptoms become significantly worse during the night or over the weekend, proceed to the local emergency room.      Final Clinical Impressions(s) / UC Diagnoses   Final diagnoses:  Injury  Avulsion of skin of middle finger, initial encounter    New Prescriptions New Prescriptions   DOXYCYCLINE (VIBRAMYCIN) 100 MG CAPSULE    Take 1 capsule (100 mg total) by mouth 2 (two) times daily. Take with food.     Lattie Haw, MD 01/29/16 1052

## 2016-01-20 NOTE — Discharge Instructions (Signed)
Elevate hand as much as possible.  Keep bandage clean and dry.  Return for dressing change in 3 days.  May take Ibuprofen 200mg , 4 tabs every 8 hours with food for pain. If symptoms become significantly worse during the night or over the weekend, proceed to the local emergency room.

## 2016-01-20 NOTE — ED Triage Notes (Signed)
Patient just cut tip of left middle finger off with table saw while working on [employer] flooring. States had tetanus immunization less than 6 years ago.

## 2018-02-10 IMAGING — DX DG FINGER MIDDLE 2+V*L*
3 series · 3 of 3 positions shown · non-contrast
Comparison: None.

CLINICAL DATA: Table saw injury to the distal left third finger.
Initial encounter.

EXAM:
LEFT MIDDLE FINGER 2+V

[finger ap]
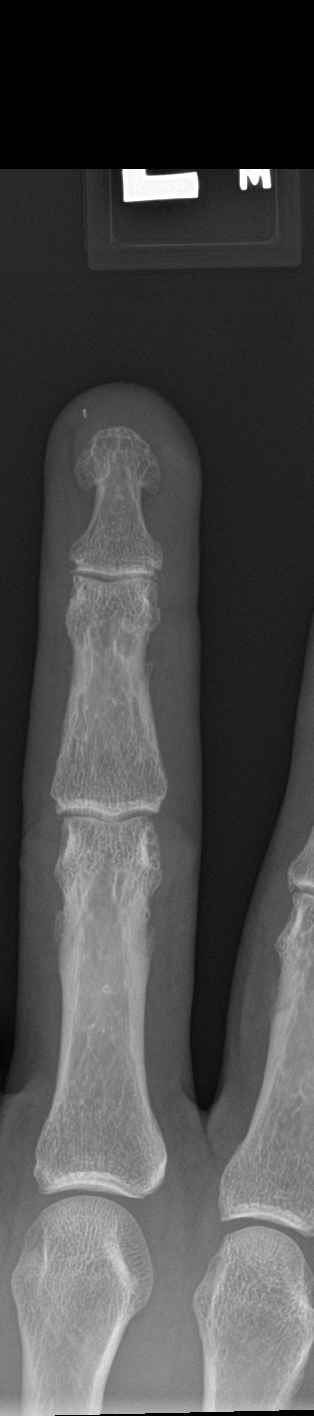

[finger obl]
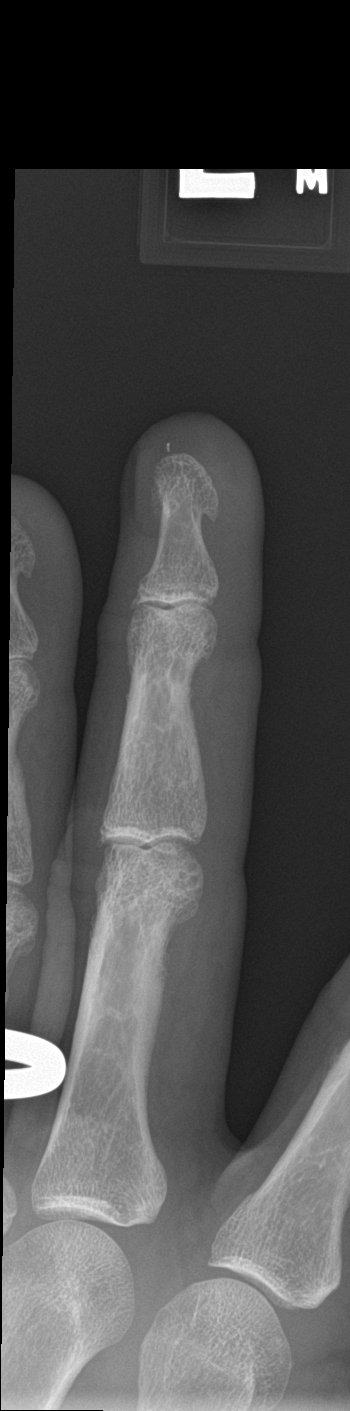

[finger lat]
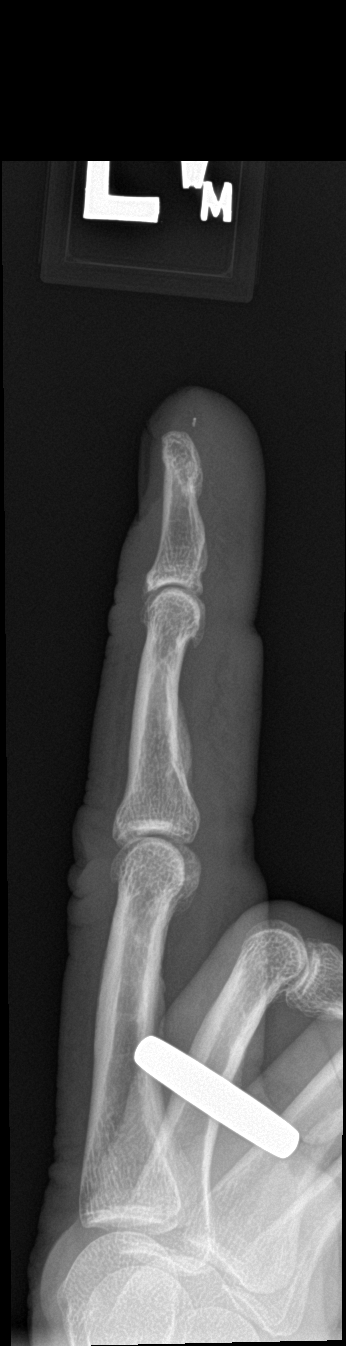

[3 of 3 positions shown; findings below may reference images not displayed]

FINDINGS: No bony fracture or dislocation is identified. There is a small
metallic radiopaque foreign body in the soft tissues at the tip of
the digit.
IMPRESSION: No fracture identified. Tiny metallic radiopaque foreign body in the
tip of the left third finger.
